# Patient Record
Sex: Female | Born: 1990 | Race: White | Hispanic: Yes | Marital: Married | Smoking: Former smoker
Health system: Southern US, Community
[De-identification: ages and names within clinical notes are randomized; demographics above are authoritative.]

## PROBLEM LIST (undated history)

## (undated) HISTORY — PX: NO PAST SURGERIES: SHX2092

---
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
## 2012-03-09 ENCOUNTER — Encounter: Payer: Self-pay | Admitting: Family Medicine

## 2012-03-28 ENCOUNTER — Encounter: Payer: Self-pay | Admitting: Family Medicine

## 2013-11-10 IMAGING — US US OB < 14 WEEKS - US OB TV
1 series · 14 of 28 positions shown · non-contrast
Comparison: none

REASON FOR EXAM: 8 wks  with vaginal bleeding eval for viability
COMMENTS:

[Series 1: us ob < 14 weeks - us ob tv · 0.37mm/px · 14 of 67 slices shown]
[im 3/67]
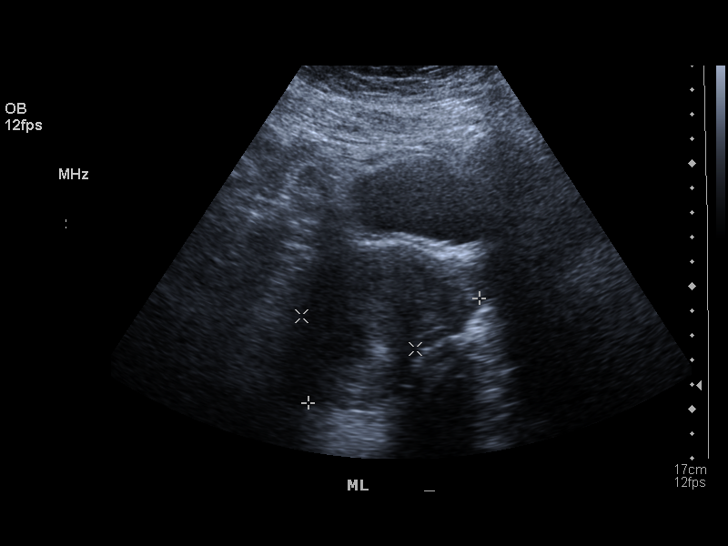
[im 8/67]
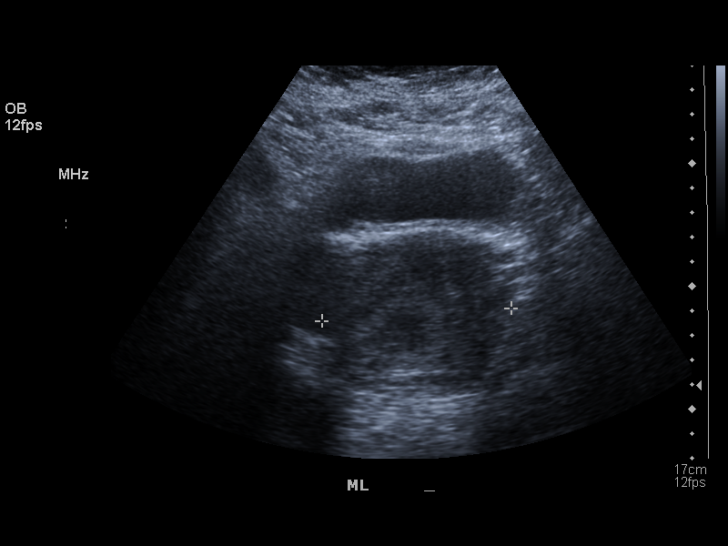
[im 13/67]
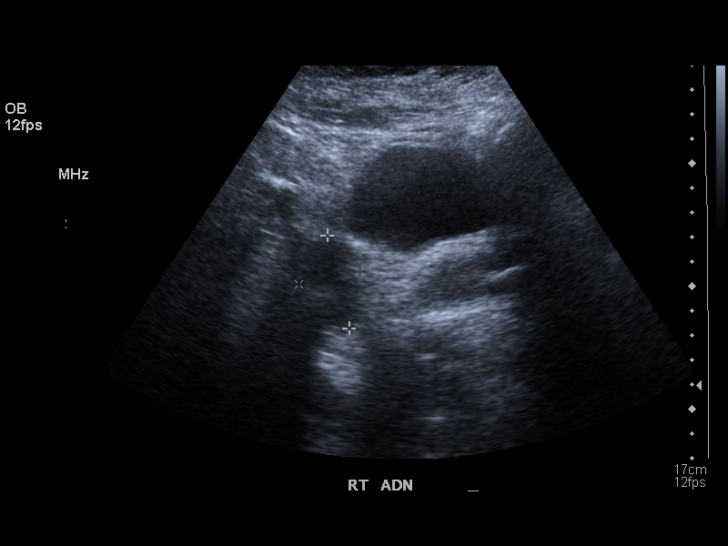
[im 18/67]
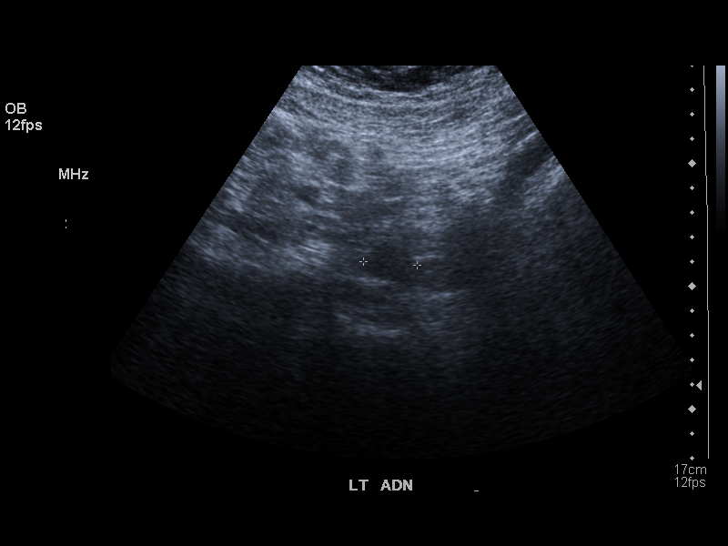
[im 23/67]
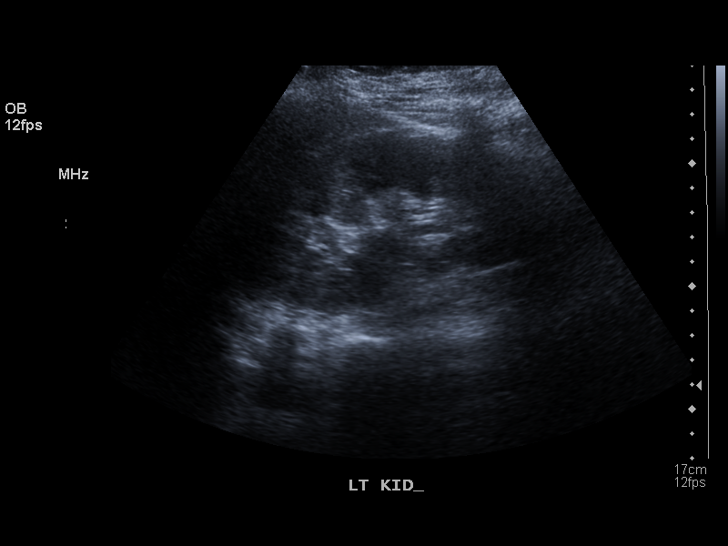
[im 27/67]
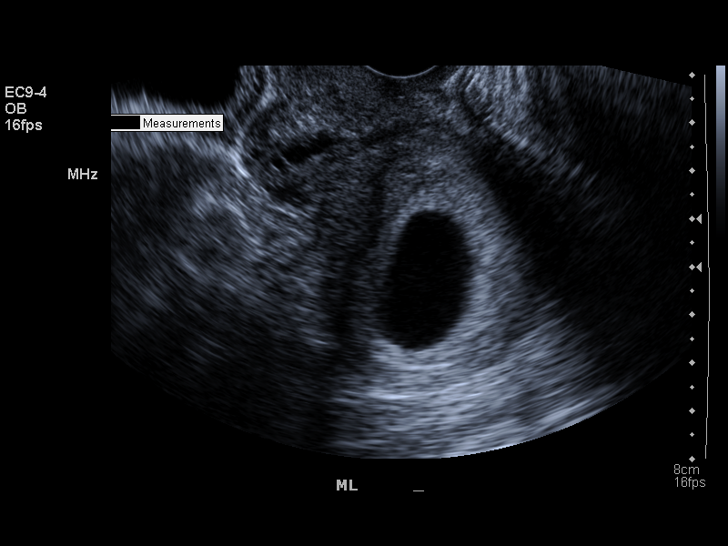
[im 32/67]
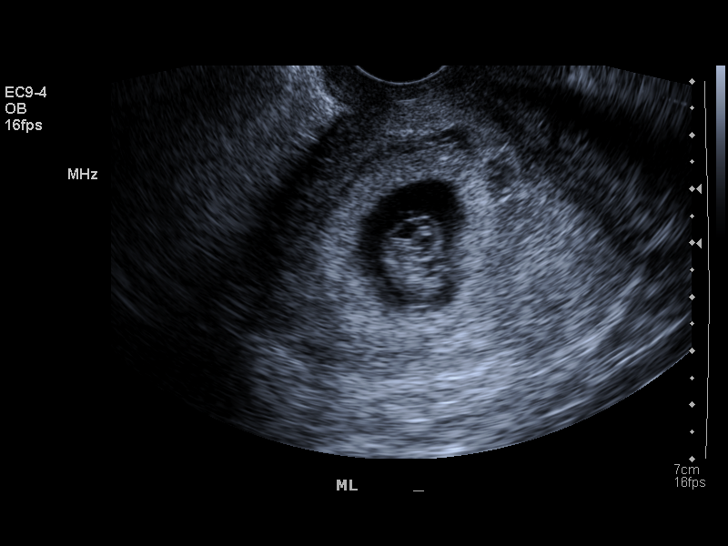
[im 37/67]
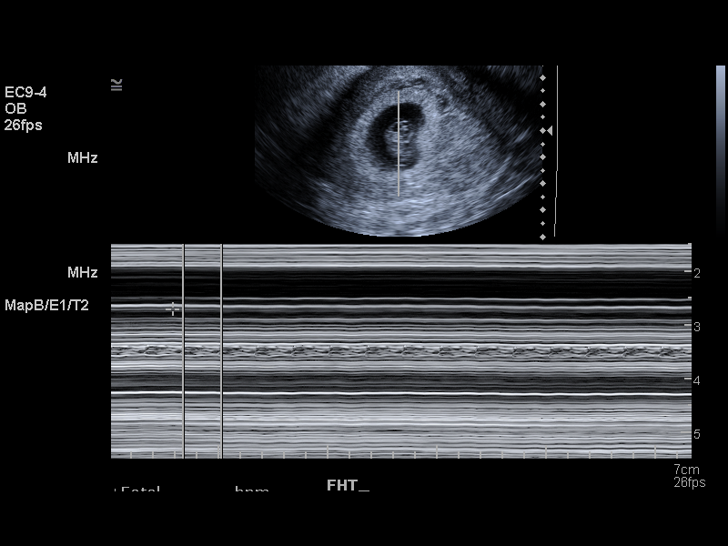
[im 42/67]
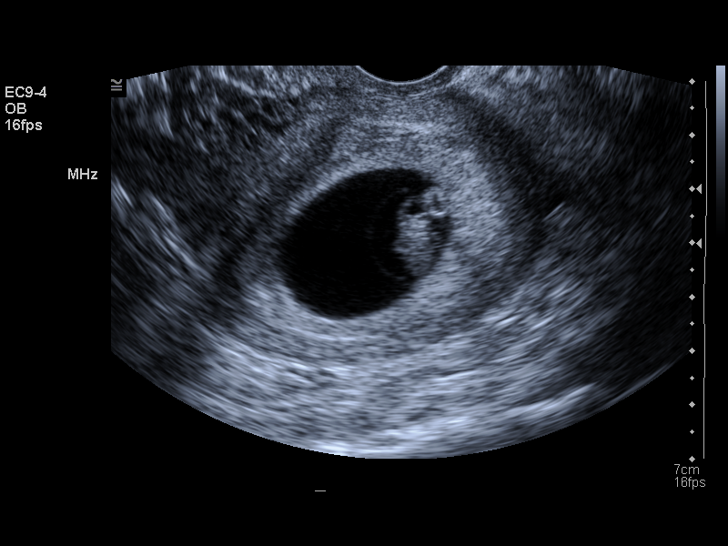
[im 47/67]
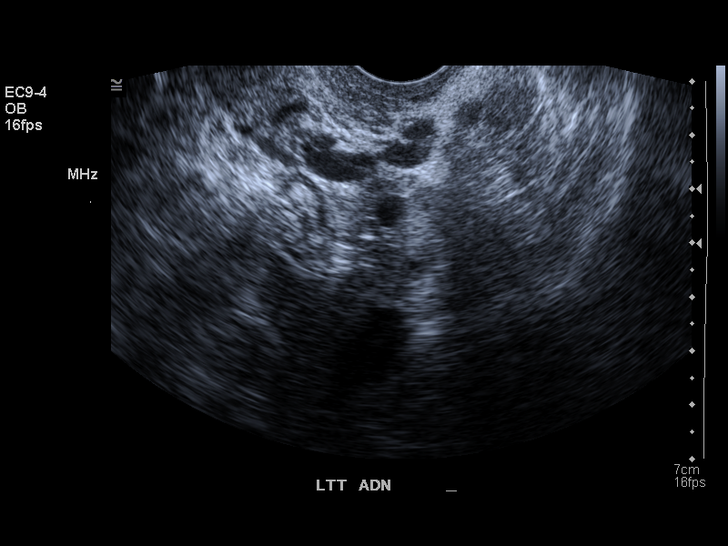
[im 52/67]
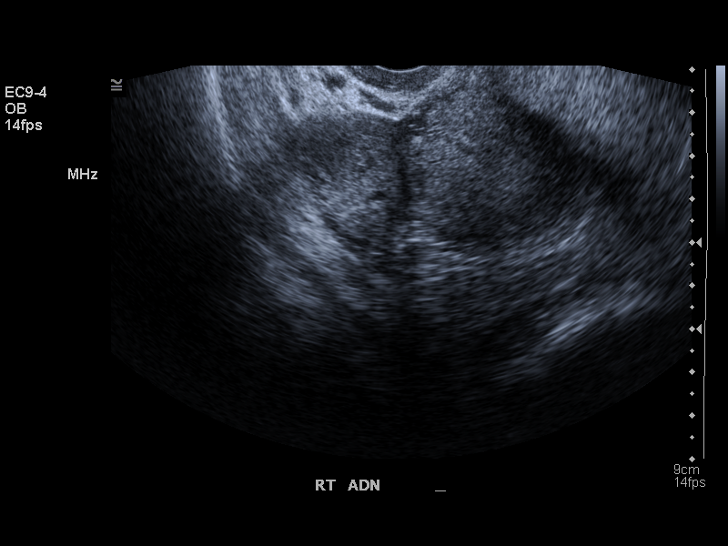
[im 57/67]
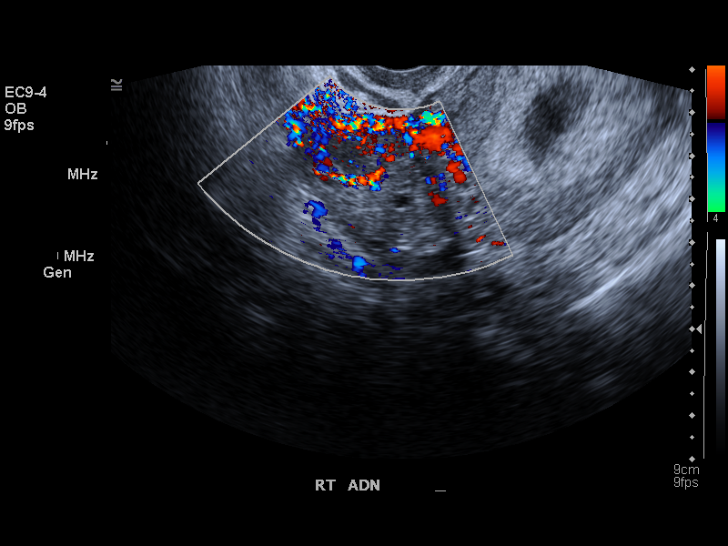
[im 62/67]
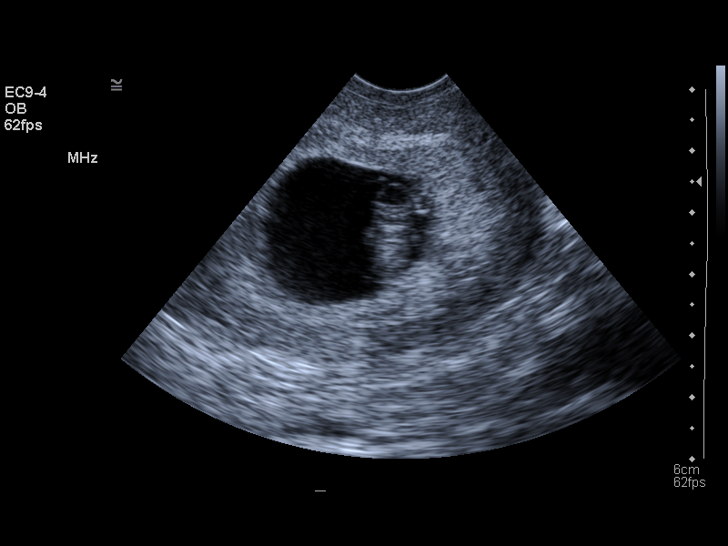
[im 67/67]
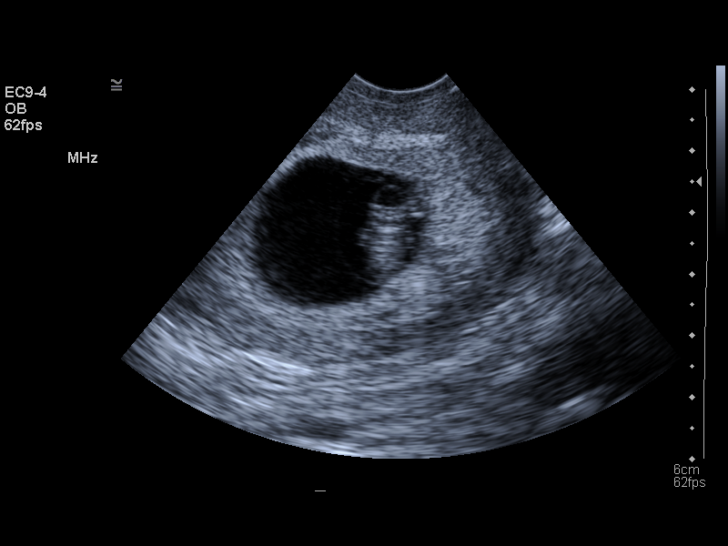

[14 of 28 positions shown; findings below may reference images not displayed]

PROCEDURE:     US  - US OB LESS THAN 14 WEEKS/W TRANS  - August 24, 2011  [DATE]

RESULT:     There is observed a single living intrauterine gestation. The
yolk sac is visualized. Embryo heart rate was monitored at 171 beats per
minute. The crown-rump length measures 16.7 mm which corresponds to an
estimated menstrual age of 8 weeks-3 day. Ultrasound EDD is 03/30/2012.
IMPRESSION: 1. There is a living intrauterine gestation of approximately 8 weeks-3 day
menstrual age.
2. The maternal ovaries are visualized bilaterally and are normal in
appearance.
3. No free fluid is seen in the maternal pelvis.
4. The maternal kidneys are visualized bilaterally and show no
hydronephrosis.

## 2013-12-16 IMAGING — US US OB < 14 WEEKS - US OB TV
1 series · 17 of 28 positions shown · non-contrast
Comparison: none

REASON FOR EXAM: vaginal bleeding
COMMENTS:

[Series 1: us ob < 14 weeks - us ob tv · 17 of 33 slices shown]
[im 1/33]
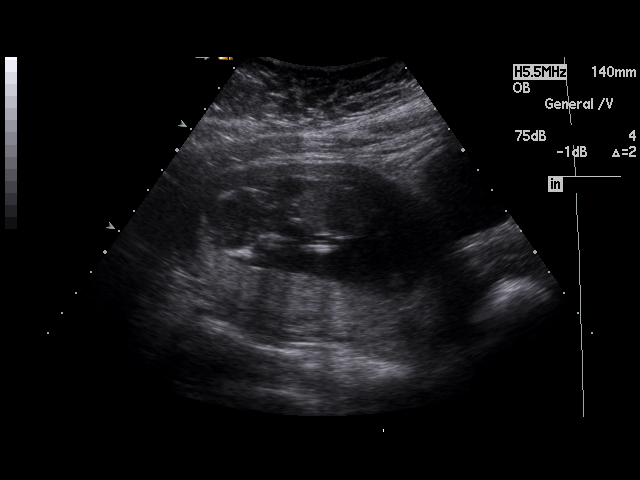
[im 3/33]
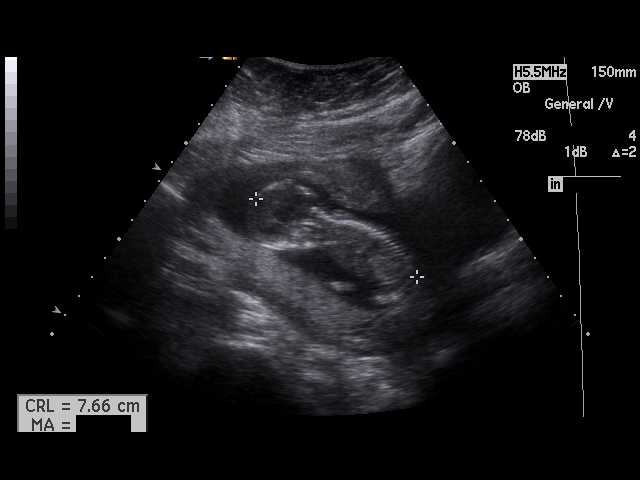
[im 5/33]
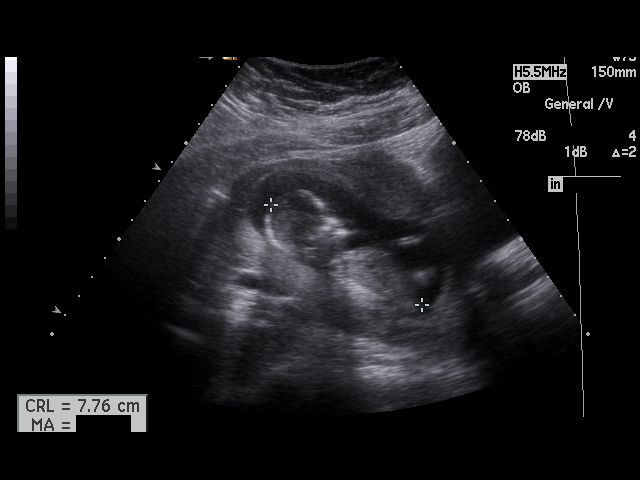
[im 6/33]
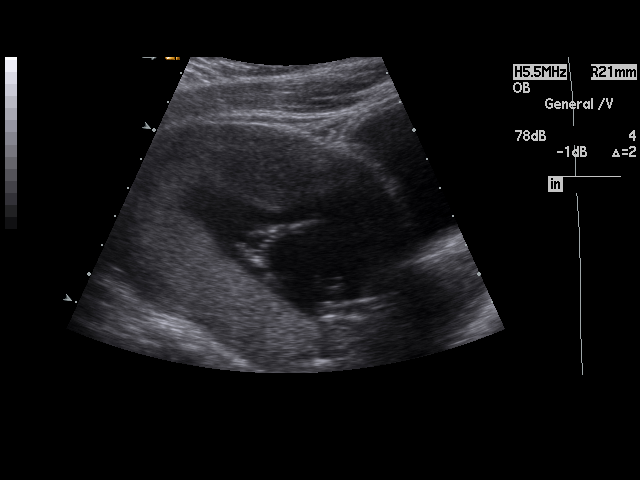
[im 9/33]
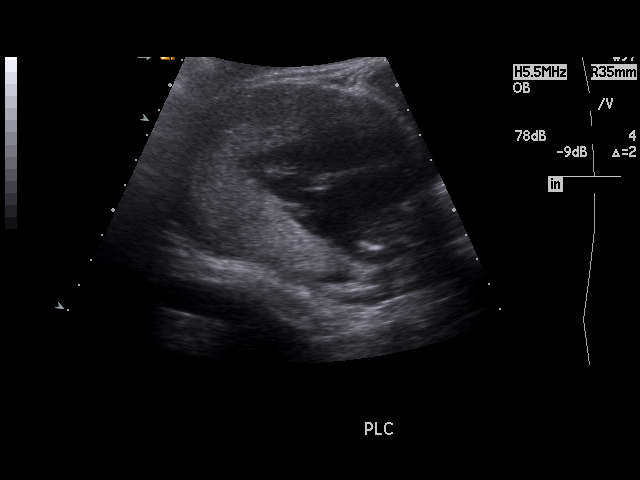
[im 11/33]
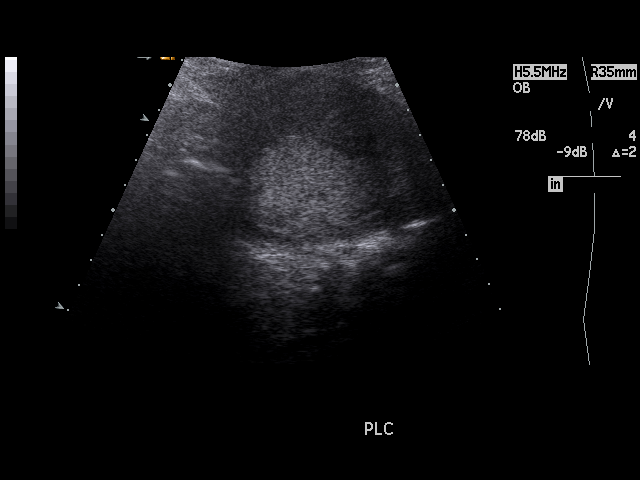
[im 12/33]
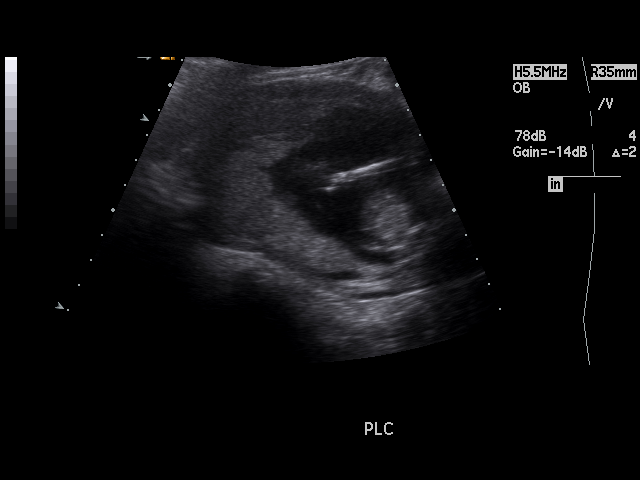
[im 15/33]
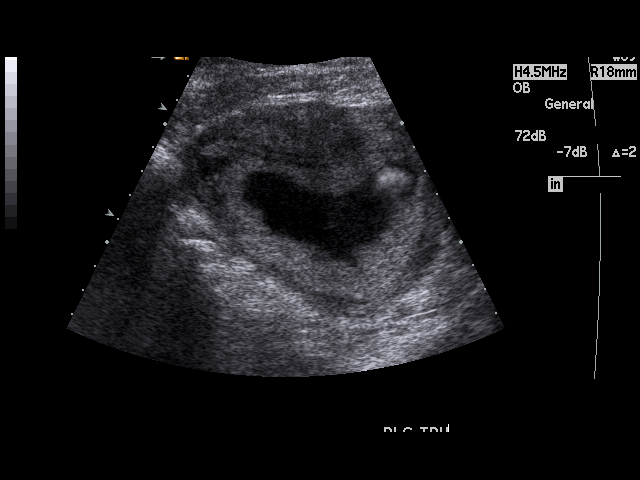
[im 17/33]
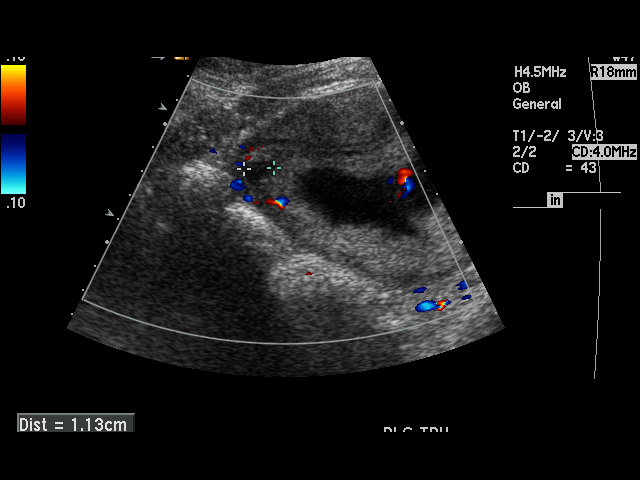
[im 18/33]
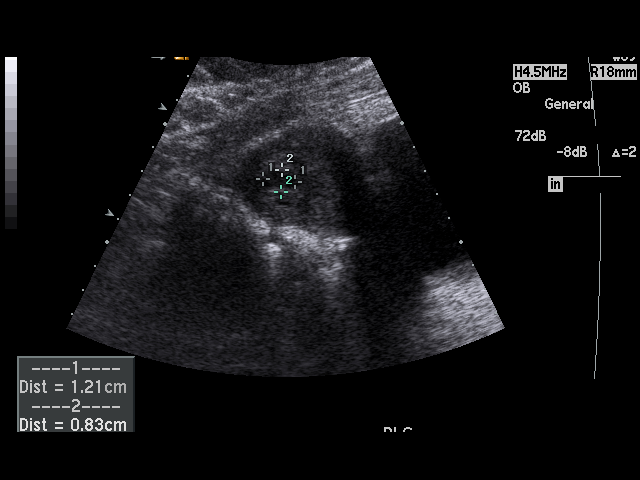
[im 21/33]
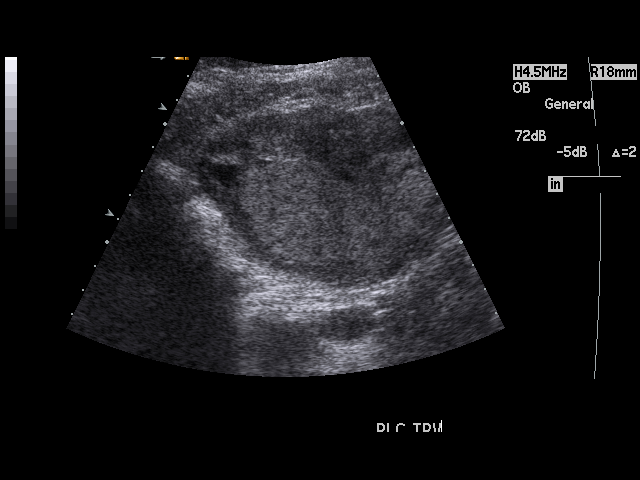
[im 22/33]
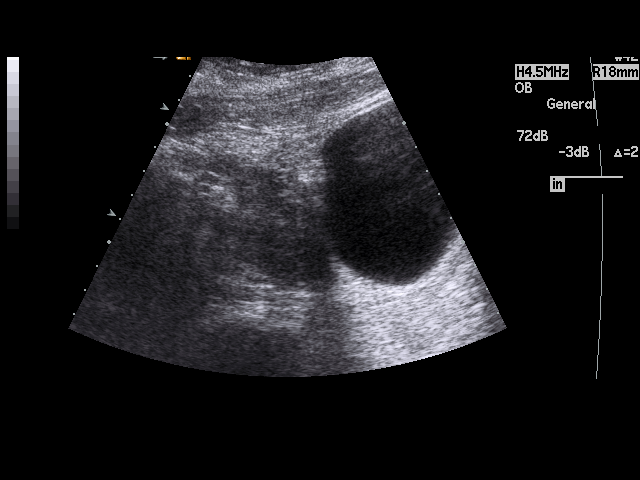
[im 24/33]
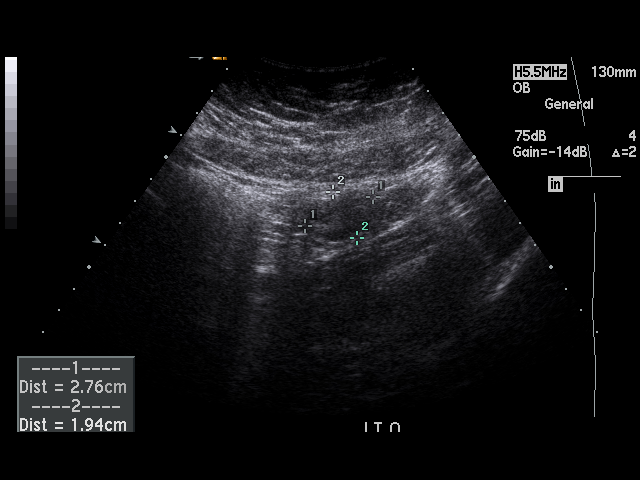
[im 27/33]
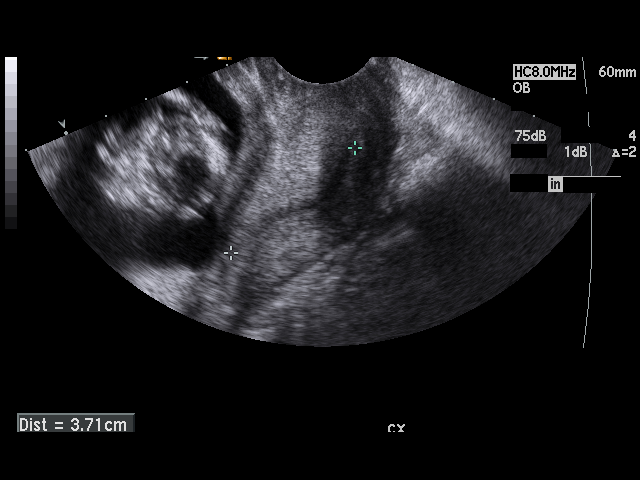
[im 28/33]
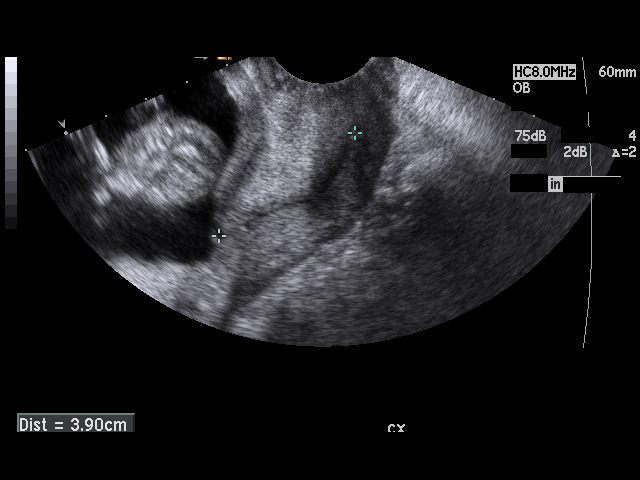
[im 30/33]
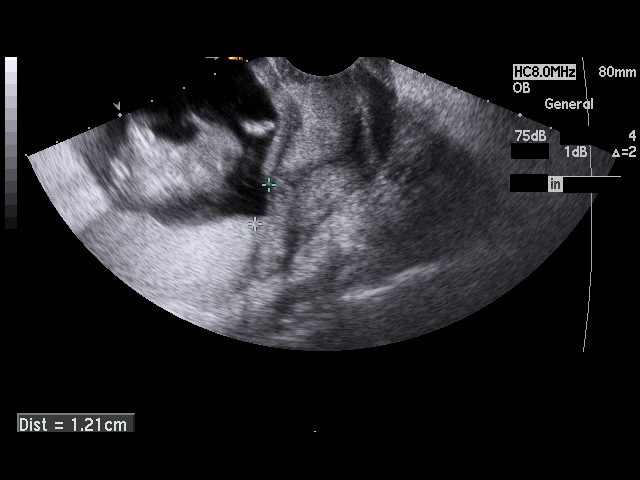
[im 33/33]
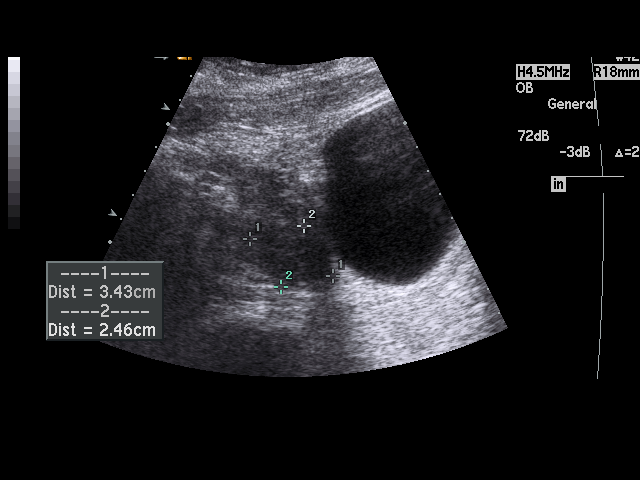

[17 of 28 positions shown; findings below may reference images not displayed]

PROCEDURE:     US  - US OB LESS THAN 14 WEEKS/W TRANS  - September 29, 2011  [DATE]

RESULT:     Early OB protocol pelvic sonogram is obtained. The study
demonstrates a single intrauterine fetus with a heart rate of 160 beats per
minute. No adnexal mass is seen. There is a 7.81 cm mean crown-rump length
measurement consistent with a 13 week 6 days + / - 8 days intrauterine
gestation with an ultrasound EDD March 30, 2012. The cervix is closed
and measures 3.90 cm in length. The distance from the edge of the placenta
to the internal cervical os is 1.37 cm. There is a small area of
hypoechogenic material consistent with a subchorionic hemorrhage measuring
up to 1.21 x 0.83 x 1.13 cm. The placenta appears to be fundal and
posterior. The ovaries are seen and appear within normal limits. The right
ovary measures 3.4 cm. The left ovary measures 3.1 cm. No free fluid is
evident. When compared to the previous exam of August 24, 2011, there
appears to have been appropriate interval growth.
IMPRESSION: 1. Subchorionic hemorrhage.
2. Viable intrauterine fetus.
3. Other findings as discussed above.

## 2015-09-22 ENCOUNTER — Other Ambulatory Visit: Payer: Self-pay | Admitting: Nephrology

## 2015-09-22 DIAGNOSIS — R109 Unspecified abdominal pain: Secondary | ICD-10-CM

## 2015-09-29 ENCOUNTER — Ambulatory Visit: Payer: Managed Care, Other (non HMO)

## 2019-06-29 NOTE — L&D Delivery Note (Signed)
Delivery Note  Date of delivery: 06/14/2020 Estimated Date of Delivery: 06/27/20 Patient's last menstrual period was 08/27/2019 (approximate). EGA: [redacted]w[redacted]d  Delivery Note At 12:37 AM a viable Female was delivered via Vaginal, Spontaneous (Presentation: Right Occiput Anterior).  APGAR: , ; weight  Pending.  Placenta status: Spontaneous, Intact.  Cord: 3 vessels with the following complications:  none.  Cord pH: n/a  First Stage: Labor onset: ~2100 Augmentation : None Analgesia /Anesthesia intrapartum: Epidural SROM at 0900  Shannon Rodgers presented to L&D with PROM. She was given 1 dose of cytotec. Epidural placed.   Second Stage: Complete dilation at 0012 Onset of pushing at 0028 FHR second stage Cat I Delivery at 0037 on 06/14/2020  She progressed to complete and had a spontaneous vaginal birth of a live female over an intact perineum. The fetal head was delivered in OA position with restitution to ROA. No nuchal cord however the cord was wrapped around baby's right wrist. Anterior then posterior shoulders delivered spontaneously. Baby placed on mom's abdomen and attended to by transition RN. Cord clamped and cut when pulseless by FOB.   Third Stage: Placenta delivered intact with 3VC at 0041 Placenta disposition: routine Uterine tone firm / bleeding min IV pitocin given for hemorrhage prophylaxis  Anesthesia: Epidural Episiotomy: None Lacerations:  none Suture Repair: n/a Est. Blood Loss (mL):   Complications: compound presentation  Mom to postpartum.  Baby to Couplet care / Skin to Skin.  Newborn: Birth Weight: pending  Apgar Scores: 8, 9 Feeding planned: Breastfeeding   [name], CNM 06/14/2020 12:46 AM

## 2020-01-10 LAB — OB RESULTS CONSOLE ABO/RH: RH Type: POSITIVE

## 2020-01-10 LAB — OB RESULTS CONSOLE VARICELLA ZOSTER ANTIBODY, IGG: Varicella: IMMUNE

## 2020-01-10 LAB — OB RESULTS CONSOLE RUBELLA ANTIBODY, IGM: Rubella: IMMUNE

## 2020-01-10 LAB — OB RESULTS CONSOLE HEPATITIS B SURFACE ANTIGEN: Hepatitis B Surface Ag: NEGATIVE

## 2020-01-10 LAB — OB RESULTS CONSOLE HIV ANTIBODY (ROUTINE TESTING): HIV: NONREACTIVE

## 2020-01-10 LAB — OB RESULTS CONSOLE ANTIBODY SCREEN: Antibody Screen: NEGATIVE

## 2020-01-29 ENCOUNTER — Observation Stay: Payer: Medicaid Other

## 2020-01-29 ENCOUNTER — Observation Stay
Admission: EM | Admit: 2020-01-29 | Discharge: 2020-01-30 | Disposition: A | Discharge status: Home / Self Care | Payer: Medicaid Other | Attending: Certified Nurse Midwife | Admitting: Certified Nurse Midwife

## 2020-01-29 ENCOUNTER — Other Ambulatory Visit: Payer: Self-pay

## 2020-01-29 DIAGNOSIS — Z3A20 20 weeks gestation of pregnancy: Secondary | ICD-10-CM | POA: Diagnosis not present

## 2020-01-29 DIAGNOSIS — R1030 Lower abdominal pain, unspecified: Secondary | ICD-10-CM | POA: Diagnosis present

## 2020-01-29 DIAGNOSIS — O26899 Other specified pregnancy related conditions, unspecified trimester: Secondary | ICD-10-CM

## 2020-01-29 DIAGNOSIS — O99891 Other specified diseases and conditions complicating pregnancy: Secondary | ICD-10-CM | POA: Diagnosis present

## 2020-01-29 DIAGNOSIS — O26712 Subluxation of symphysis (pubis) in pregnancy, second trimester: Secondary | ICD-10-CM | POA: Diagnosis not present

## 2020-01-29 DIAGNOSIS — R109 Unspecified abdominal pain: Secondary | ICD-10-CM

## 2020-01-29 DIAGNOSIS — N949 Unspecified condition associated with female genital organs and menstrual cycle: Secondary | ICD-10-CM | POA: Diagnosis present

## 2020-01-29 LAB — WET PREP, GENITAL
Clue Cells Wet Prep HPF POC: NONE SEEN
Sperm: NONE SEEN
Trich, Wet Prep: NONE SEEN
Yeast Wet Prep HPF POC: NONE SEEN

## 2020-01-29 LAB — URINALYSIS, ROUTINE W REFLEX MICROSCOPIC
Bacteria, UA: NONE SEEN
Bilirubin Urine: NEGATIVE
Glucose, UA: NEGATIVE mg/dL
Hgb urine dipstick: NEGATIVE
Ketones, ur: NEGATIVE mg/dL
Nitrite: NEGATIVE
Protein, ur: NEGATIVE mg/dL
Specific Gravity, Urine: 1.02 (ref 1.005–1.030)
pH: 5 (ref 5.0–8.0)

## 2020-01-29 MED ORDER — ACETAMINOPHEN 325 MG PO TABS: 650.0000 mg | ORAL_TABLET | ORAL | Status: DC | PRN

## 2020-01-29 NOTE — OB Triage Note (Signed)
29yo, G2P1, pt reports lower back pain and sharp pain in the lower abdomen for the last 3 days more at night when she is lying in bed. Pt reports no bleeding, but does report discharge white in color. Pt reports no N/V/D, but does report having a soft stool BM today. No sexual intercourse in the last 24 hours.

## 2020-01-30 DIAGNOSIS — N949 Unspecified condition associated with female genital organs and menstrual cycle: Secondary | ICD-10-CM | POA: Diagnosis present

## 2020-01-30 DIAGNOSIS — O99891 Other specified diseases and conditions complicating pregnancy: Secondary | ICD-10-CM | POA: Diagnosis not present

## 2020-01-30 LAB — CBC
HCT: 31.5 % — ABNORMAL LOW (ref 36.0–46.0)
Hemoglobin: 10.7 g/dL — ABNORMAL LOW (ref 12.0–15.0)
MCH: 27.6 pg (ref 26.0–34.0)
MCHC: 34 g/dL (ref 30.0–36.0)
MCV: 81.2 fL (ref 80.0–100.0)
Platelets: 214 10*3/uL (ref 150–400)
RBC: 3.88 MIL/uL (ref 3.87–5.11)
RDW: 13.1 % (ref 11.5–15.5)
WBC: 9.1 10*3/uL (ref 4.0–10.5)
nRBC: 0 % (ref 0.0–0.2)

## 2020-01-30 LAB — CHLAMYDIA/NGC RT PCR (ARMC ONLY)
Chlamydia Tr: NOT DETECTED
N gonorrhoeae: NOT DETECTED

## 2020-01-30 LAB — URINE CULTURE: Culture: NO GROWTH

## 2020-01-30 NOTE — Discharge Summary (Signed)
Patient ID: Shannon Rodgers MRN: [id number] DOB/AGE: [birth year] 29 y.o.  Admit date: 01/29/2020 Discharge date: 01/30/2020  Admission Diagnoses: Pain at the pubic bone  Discharge Diagnoses: Smphysis pubis dysfunction  Prenatal Procedures: none  Consults: None  Significant Diagnostic Studies:  Results for orders placed or performed during the hospital encounter of 01/29/20 (from the past 168 hour(s))  Wet prep, genital   Collection Time: 01/29/20 11:20 PM  Result Value Ref Range   Yeast Wet Prep HPF POC NONE SEEN NONE SEEN   Trich, Wet Prep NONE SEEN NONE SEEN   Clue Cells Wet Prep HPF POC NONE SEEN NONE SEEN   WBC, Wet Prep HPF POC FEW (A) NONE SEEN   Sperm NONE SEEN   Chlamydia/NGC rt PCR ([facility] only)   Collection Time: 01/29/20 11:20 PM  Result Value Ref Range   Specimen source GC/Chlam URINE, RANDOM    Chlamydia Tr NOT DETECTED NOT DETECTED   N gonorrhoeae NOT DETECTED NOT DETECTED  Urinalysis, Routine w reflex microscopic   Collection Time: 01/29/20 11:20 PM  Result Value Ref Range   Color, Urine YELLOW (A) YELLOW   APPearance HAZY (A) CLEAR   Specific Gravity, Urine 1.020 1.005 - 1.030   pH 5.0 5.0 - 8.0   Glucose, UA NEGATIVE NEGATIVE mg/dL   Hgb urine dipstick NEGATIVE NEGATIVE   Bilirubin Urine NEGATIVE NEGATIVE   Ketones, ur NEGATIVE NEGATIVE mg/dL   Protein, ur NEGATIVE NEGATIVE mg/dL   Nitrite NEGATIVE NEGATIVE   Leukocytes,Ua SMALL (A) NEGATIVE   RBC / HPF 0-5 0 - 5 RBC/hpf   WBC, UA 0-5 0 - 5 WBC/hpf   Bacteria, UA NONE SEEN NONE SEEN   Squamous Epithelial / LPF 0-5 0 - 5   Mucus PRESENT   CBC   Collection Time: 01/30/20 12:43 AM  Result Value Ref Range   WBC 9.1 4.0 - 10.5 K/uL   RBC 3.88 3.87 - 5.11 MIL/uL   Hemoglobin 10.7 (L) 12.0 - 15.0 g/dL   HCT 63.8 (L) 36 - 46 %   MCV 81.2 80.0 - 100.0 fL   MCH 27.6 26.0 - 34.0 pg   MCHC 34.0 30.0 - 36.0 g/dL   RDW 75.6 43.3 - 29.5 %   Platelets 214 150 - 400 K/uL   nRBC 0.0 0.0 - 0.2 %   EXAM: LIMITED  OBSTETRIC ULTRASOUND FINDINGS: Number of Fetuses: 1 Heart Rate:  147 bpm Movement: Yes Presentation: Breech Placental Location: Anterior Previa: No Amniotic Fluid (Subjective):  Within normal limits. BPD: 4.2 cm 18 w  5 d MATERNAL FINDINGS: Cervix:  Appears closed. Uterus/Adnexae: No abnormality visualized. IMPRESSION: Single viable intrauterine pregnancy. No specific abnormalities are visualized.  Treatments: ultrasound  Hospital Course:  This is a 29 y.o. G2P1 with IUP at [redacted]w[redacted]d seen for pubic bone pain.  Only seen once by CD and has not yet had an ultrasound with this pregnancy.  Labs and u/s done.  She was observed, and had no signs/symptoms of maternal-fetal concerns.  Precautions discussed. She was deemed stable for discharge to home with outpatient follow up.  Discharge Physical Exam:  BP 129/69 (BP Location: Left Arm)   Pulse 98   Temp 97.6 F (36.4 C) (Oral)   Resp 16   LMP 08/27/2019 (Approximate)   General: NAD CV: RRR Pulm: CTABL, nl effort ABD: s/nd/nt, gravid DVT Evaluation: LE non-ttp, no evidence of DVT on exam.       Discharge Condition: Stable  Disposition: Discharge disposition: 01-Home or Self Care  Allergies as of 01/30/2020      Reactions   Penicillins Swelling      Medication List    You have not been prescribed any medications.     Follow-up Information    Center, [facility]. Schedule an appointment as soon as possible for a visit.   Specialty: General Practice Contact information: [address] [place] [address] [phone]               Signed:  Haroldine Laws, CNM 01/30/2020 1:42 AM

## 2020-06-04 LAB — OB RESULTS CONSOLE RPR: RPR: NONREACTIVE

## 2020-06-05 LAB — OB RESULTS CONSOLE GBS: GBS: NEGATIVE

## 2020-06-06 LAB — OB RESULTS CONSOLE GC/CHLAMYDIA
Chlamydia: NEGATIVE
Gonorrhea: NEGATIVE

## 2020-06-13 ENCOUNTER — Inpatient Hospital Stay: Payer: Medicaid Other | Admitting: Anesthesiology

## 2020-06-13 ENCOUNTER — Other Ambulatory Visit: Payer: Self-pay

## 2020-06-13 ENCOUNTER — Encounter: Payer: Self-pay | Admitting: Obstetrics and Gynecology

## 2020-06-13 ENCOUNTER — Inpatient Hospital Stay
Admission: EM | Admit: 2020-06-13 | Discharge: 2020-06-15 | DRG: 807 | Disposition: A | Discharge status: Home / Self Care | Payer: Medicaid Other | Attending: Certified Nurse Midwife | Admitting: Certified Nurse Midwife

## 2020-06-13 DIAGNOSIS — Z3A38 38 weeks gestation of pregnancy: Secondary | ICD-10-CM

## 2020-06-13 DIAGNOSIS — O163 Unspecified maternal hypertension, third trimester: Secondary | ICD-10-CM | POA: Diagnosis present

## 2020-06-13 DIAGNOSIS — O0930 Supervision of pregnancy with insufficient antenatal care, unspecified trimester: Secondary | ICD-10-CM

## 2020-06-13 DIAGNOSIS — O326XX0 Maternal care for compound presentation, not applicable or unspecified: Secondary | ICD-10-CM | POA: Diagnosis present

## 2020-06-13 DIAGNOSIS — O4292 Full-term premature rupture of membranes, unspecified as to length of time between rupture and onset of labor: Secondary | ICD-10-CM | POA: Diagnosis present

## 2020-06-13 DIAGNOSIS — O26893 Other specified pregnancy related conditions, third trimester: Secondary | ICD-10-CM | POA: Diagnosis present

## 2020-06-13 DIAGNOSIS — O4290 Premature rupture of membranes, unspecified as to length of time between rupture and onset of labor, unspecified weeks of gestation: Secondary | ICD-10-CM | POA: Diagnosis present

## 2020-06-13 DIAGNOSIS — Z20822 Contact with and (suspected) exposure to covid-19: Secondary | ICD-10-CM | POA: Diagnosis present

## 2020-06-13 DIAGNOSIS — Z6841 Body Mass Index (BMI) 40.0 and over, adult: Secondary | ICD-10-CM

## 2020-06-13 DIAGNOSIS — O99214 Obesity complicating childbirth: Secondary | ICD-10-CM | POA: Diagnosis present

## 2020-06-13 LAB — URINE DRUG SCREEN, QUALITATIVE (ARMC ONLY)
Amphetamines, Ur Screen: NOT DETECTED
Barbiturates, Ur Screen: NOT DETECTED
Benzodiazepine, Ur Scrn: NOT DETECTED
Cannabinoid 50 Ng, Ur ~~LOC~~: NOT DETECTED
Cocaine Metabolite,Ur ~~LOC~~: NOT DETECTED
MDMA (Ecstasy)Ur Screen: NOT DETECTED
Methadone Scn, Ur: NOT DETECTED
Opiate, Ur Screen: NOT DETECTED
Phencyclidine (PCP) Ur S: NOT DETECTED
Tricyclic, Ur Screen: NOT DETECTED

## 2020-06-13 LAB — COMPREHENSIVE METABOLIC PANEL
ALT: 11 U/L (ref 0–44)
AST: 14 U/L — ABNORMAL LOW (ref 15–41)
Albumin: 2.7 g/dL — ABNORMAL LOW (ref 3.5–5.0)
Alkaline Phosphatase: 91 U/L (ref 38–126)
Anion gap: 11 (ref 5–15)
BUN: 10 mg/dL (ref 6–20)
CO2: 20 mmol/L — ABNORMAL LOW (ref 22–32)
Calcium: 9 mg/dL (ref 8.9–10.3)
Chloride: 105 mmol/L (ref 98–111)
Creatinine, Ser: 0.47 mg/dL (ref 0.44–1.00)
GFR, Estimated: >60 mL/min (ref >60)
Glucose, Bld: 83 mg/dL (ref 70–99)
Potassium: 3.8 mmol/L (ref 3.5–5.1)
Sodium: 136 mmol/L (ref 135–145)
Total Bilirubin: 0.3 mg/dL (ref 0.3–1.2)
Total Protein: 6.3 g/dL — ABNORMAL LOW (ref 6.5–8.1)

## 2020-06-13 LAB — PROTEIN / CREATININE RATIO, URINE
Creatinine, Urine: 169 mg/dL
Protein Creatinine Ratio: 0.09 mg/mg{Cre} (ref 0.00–0.15)
Total Protein, Urine: 16 mg/dL

## 2020-06-13 LAB — TYPE AND SCREEN
ABO/RH(D): B POS
Antibody Screen: NEGATIVE

## 2020-06-13 LAB — CBC
HCT: 34 % — ABNORMAL LOW (ref 36.0–46.0)
Hemoglobin: 11.4 g/dL — ABNORMAL LOW (ref 12.0–15.0)
MCH: 26 pg (ref 26.0–34.0)
MCHC: 33.5 g/dL (ref 30.0–36.0)
MCV: 77.4 fL — ABNORMAL LOW (ref 80.0–100.0)
Platelets: 215 10*3/uL (ref 150–400)
RBC: 4.39 MIL/uL (ref 3.87–5.11)
RDW: 14.6 % (ref 11.5–15.5)
WBC: 9 10*3/uL (ref 4.0–10.5)
nRBC: 0 % (ref 0.0–0.2)

## 2020-06-13 LAB — RESP PANEL BY RT-PCR (FLU A&B, COVID) ARPGX2
Influenza A by PCR: NEGATIVE
Influenza B by PCR: NEGATIVE
SARS Coronavirus 2 by RT PCR: NEGATIVE

## 2020-06-13 LAB — ABO/RH: ABO/RH(D): B POS

## 2020-06-13 MED ORDER — PHENYLEPHRINE 40 MCG/ML (10ML) SYRINGE FOR IV PUSH (FOR BLOOD PRESSURE SUPPORT): 80.0000 ug | PREFILLED_SYRINGE | INTRAVENOUS | Status: DC | PRN

## 2020-06-13 MED ORDER — DIPHENHYDRAMINE HCL 50 MG/ML IJ SOLN: 12.5000 mg | INTRAMUSCULAR | Status: DC | PRN

## 2020-06-13 MED ORDER — EPHEDRINE 5 MG/ML INJ: 10.0000 mg | INTRAVENOUS | Status: DC | PRN

## 2020-06-13 MED ORDER — MISOPROSTOL 50MCG HALF TABLET
ORAL_TABLET | ORAL | Status: AC
  Administered 2020-06-13: 16:00:00 50 ug via VAGINAL
  Filled 2020-06-13: qty 1

## 2020-06-13 MED ORDER — SODIUM CHLORIDE 0.9 % IV SOLN
INTRAVENOUS | Status: DC | PRN
  Administered 2020-06-13: 10 mL via EPIDURAL

## 2020-06-13 MED ORDER — MISOPROSTOL 25 MCG QUARTER TABLET
ORAL_TABLET | ORAL | Status: AC
  Filled 2020-06-13: qty 1

## 2020-06-13 MED ORDER — OXYTOCIN 10 UNIT/ML IJ SOLN
INTRAMUSCULAR | Status: AC
  Filled 2020-06-13: qty 2

## 2020-06-13 MED ORDER — LIDOCAINE HCL (PF) 1 % IJ SOLN
INTRAMUSCULAR | Status: DC | PRN
  Administered 2020-06-13: 3 mL

## 2020-06-13 MED ORDER — ONDANSETRON HCL 4 MG/2ML IJ SOLN
4.0000 mg | Freq: Four times a day (QID) | INTRAMUSCULAR | Status: DC | PRN
  Administered 2020-06-14: 4 mg via INTRAVENOUS
  Filled 2020-06-13: qty 2

## 2020-06-13 MED ORDER — OXYCODONE-ACETAMINOPHEN 5-325 MG PO TABS: 2.0000 | ORAL_TABLET | ORAL | Status: DC | PRN

## 2020-06-13 MED ORDER — OXYTOCIN-SODIUM CHLORIDE 30-0.9 UT/500ML-% IV SOLN
INTRAVENOUS | Status: AC
  Filled 2020-06-13: qty 1000

## 2020-06-13 MED ORDER — SOD CITRATE-CITRIC ACID 500-334 MG/5ML PO SOLN: 30.0000 mL | ORAL | Status: DC | PRN

## 2020-06-13 MED ORDER — LIDOCAINE-EPINEPHRINE (PF) 1.5 %-1:200000 IJ SOLN
INTRAMUSCULAR | Status: DC | PRN
  Administered 2020-06-13: 3 mL via EPIDURAL

## 2020-06-13 MED ORDER — OXYTOCIN-SODIUM CHLORIDE 30-0.9 UT/500ML-% IV SOLN: 2.5000 [IU]/h | INTRAVENOUS | Status: DC

## 2020-06-13 MED ORDER — FENTANYL 2.5 MCG/ML W/ROPIVACAINE 0.15% IN NS 100 ML EPIDURAL (ARMC)
EPIDURAL | Status: AC
  Filled 2020-06-13: qty 100

## 2020-06-13 MED ORDER — LIDOCAINE HCL (PF) 1 % IJ SOLN
INTRAMUSCULAR | Status: AC
  Filled 2020-06-13: qty 30

## 2020-06-13 MED ORDER — AMMONIA AROMATIC IN INHA
RESPIRATORY_TRACT | Status: AC
  Filled 2020-06-13: qty 10

## 2020-06-13 MED ORDER — ACETAMINOPHEN 325 MG PO TABS: 650.0000 mg | ORAL_TABLET | ORAL | Status: DC | PRN

## 2020-06-13 MED ORDER — LACTATED RINGERS IV SOLN
500.0000 mL | Freq: Once | INTRAVENOUS | Status: AC
  Administered 2020-06-13: 23:00:00 500 mL via INTRAVENOUS

## 2020-06-13 MED ORDER — LACTATED RINGERS IV SOLN: 500.0000 mL | INTRAVENOUS | Status: DC | PRN

## 2020-06-13 MED ORDER — LIDOCAINE HCL (PF) 1 % IJ SOLN: 30.0000 mL | INTRAMUSCULAR | Status: DC | PRN

## 2020-06-13 MED ORDER — OXYCODONE-ACETAMINOPHEN 5-325 MG PO TABS
1.0000 | ORAL_TABLET | ORAL | Status: DC | PRN
Start: 2020-06-13 — End: 2020-06-14

## 2020-06-13 MED ORDER — LACTATED RINGERS IV SOLN: INTRAVENOUS | Status: DC

## 2020-06-13 MED ORDER — OXYTOCIN BOLUS FROM INFUSION
333.0000 mL | Freq: Once | INTRAVENOUS | Status: AC
  Administered 2020-06-14: 01:00:00 333 mL via INTRAVENOUS

## 2020-06-13 MED ORDER — MISOPROSTOL 50MCG HALF TABLET: 50.0000 ug | ORAL_TABLET | Freq: Once | ORAL | Status: AC

## 2020-06-13 MED ORDER — FENTANYL 2.5 MCG/ML W/ROPIVACAINE 0.15% IN NS 100 ML EPIDURAL (ARMC)
12.0000 mL/h | EPIDURAL | Status: DC
Start: 2020-06-14 — End: 2020-06-14
  Administered 2020-06-13: 12 mL/h via EPIDURAL

## 2020-06-13 MED ORDER — MISOPROSTOL 200 MCG PO TABS
ORAL_TABLET | ORAL | Status: AC
  Filled 2020-06-13: qty 4

## 2020-06-13 NOTE — Progress Notes (Signed)
Labor Progress Note  Shannon Rodgers is a 29 y.o. G2P1 at [redacted]w[redacted]d admitted for induction of labor due to Spontaneous rupture of BOW at 0900.  Subjective: Pt is feeling UCs more intense.  Objective: BP (!) 148/96   Pulse 90   Temp 98.2 F (36.8 C) (Oral)   Resp 15   Ht 5\' 6"  (1.676 m)   Wt 135.2 kg   LMP 08/27/2019 (Approximate)   BMI 48.10 kg/m   Fetal Assessment: FHT:  FHR: 135 bpm, variability: moderate,  accelerations:  Present,  decelerations:  Absent Category/reactivity:  Category I UC:   Regular 1-4 mins SVE:    Dilation: 1cm  Effacement: 50%  Station:  -3  Consistency: medium  Position: middle  Membrane status:SROM'd 0900 Amniotic color: Clear   Assessment / Plan: Induction of labor due to PROM, Cytotec 10/27/2019 PV given at 1629 UCs are regular and getting more intense, pt is having to breath through each contraction.  Will watch for next hour to see if labor picks up on its own.  Encouraged pt to get out of bed if she thinks that would help with pain.  Labor: Progressing normally Preeclampsia:  148/96 denies HA, vision changes or epigastric pain Fetal Wellbeing:  Category I Pain Control:  Labor support without medications I/D:  Afebrile, GBS neg, SROM x 11hr Anticipated MOD:  NSVD  , CNM 06/13/2020, 8:43 PM

## 2020-06-13 NOTE — Anesthesia Procedure Notes (Signed)
Epidural Patient location during procedure: OB Start time: 06/13/2020 10:37 PM End time: 06/13/2020 10:59 PM  Staffing Anesthesiologist: Corinda Gubler, MD Performed: anesthesiologist   Preanesthetic Checklist Completed: patient identified, IV checked, site marked, risks and benefits discussed, surgical consent, monitors and equipment checked, pre-op evaluation and timeout performed  Epidural Patient position: sitting Prep: ChloraPrep Patient monitoring: heart rate, continuous pulse ox and blood pressure Approach: midline Location: L3-L4 Injection technique: LOR saline  Needle:  Needle type: Tuohy  Needle gauge: 17 G Needle length: 9 cm and 9 Needle insertion depth: 9 cm Catheter type: closed end flexible Catheter size: 19 Gauge Catheter at skin depth: 14 cm Test dose: negative and 1.5% lidocaine with Epi 1:200 K  Assessment Sensory level: T10 Events: blood not aspirated, injection not painful, no injection resistance, no paresthesia and negative IV test  Additional Notes 1st attempt Pt. Evaluated and documentation done after procedure finished. Patient identified. Risks/Benefits/Options discussed with patient including but not limited to bleeding, infection, nerve damage, paralysis, failed block, incomplete pain control, headache, blood pressure changes, nausea, vomiting, reactions to medication both or allergic, itching and postpartum back pain. Confirmed with bedside nurse the patient's most recent platelet count. Confirmed with patient that they are not currently taking any anticoagulation, have any bleeding history or any family history of bleeding disorders. Patient expressed understanding and wished to proceed. All questions were answered. Sterile technique was used throughout the entire procedure. Please see nursing notes for vital signs. Test dose was given through epidural catheter and negative prior to continuing to dose epidural or start infusion. Warning signs of high  block given to the patient including shortness of breath, tingling/numbness in hands, complete motor block, or any concerning symptoms with instructions to call for help. Patient was given instructions on fall risk and not to get out of bed. All questions and concerns addressed with instructions to call with any issues or inadequate analgesia.   Patient tolerated the insertion well without immediate complications.Reason for block:procedure for pain

## 2020-06-13 NOTE — Anesthesia Preprocedure Evaluation (Signed)
Anesthesia Evaluation  Patient identified by MRN, date of birth, ID band Patient awake    Reviewed: Allergy & Precautions, NPO status , Patient's Chart, lab work & pertinent test results  History of Anesthesia Complications Negative for: history of anesthetic complications  Airway Mallampati: III  TM Distance: >3 FB Neck ROM: Full    Dental no notable dental hx. (+) Teeth Intact   Pulmonary neg pulmonary ROS, neg sleep apnea, neg COPD, Patient abstained from smoking.Not current smoker, former smoker,    Pulmonary exam normal breath sounds clear to auscultation       Cardiovascular Exercise Tolerance: Good METShypertension, Pt. on medications (-) CAD and (-) Past MI (-) dysrhythmias  Rhythm:Regular Rate:Normal - Systolic murmurs    Neuro/Psych negative neurological ROS  negative psych ROS   GI/Hepatic neg GERD  ,(+)     (-) substance abuse  ,   Endo/Other  neg diabetesMorbid obesity  Renal/GU negative Renal ROS     Musculoskeletal   Abdominal (+) + obese,   Peds  Hematology   Anesthesia Other Findings History reviewed. No pertinent past medical history.  Reproductive/Obstetrics (+) Pregnancy                             Anesthesia Physical Anesthesia Plan  ASA: III  Anesthesia Plan: Epidural   Post-op Pain Management:    Induction:   PONV Risk Score and Plan: 2 and Treatment may vary due to age or medical condition and Ondansetron  Airway Management Planned: Natural Airway  Additional Equipment:   Intra-op Plan:   Post-operative Plan:   Informed Consent: I have reviewed the patients History and Physical, chart, labs and discussed the procedure including the risks, benefits and alternatives for the proposed anesthesia with the patient or authorized representative who has indicated his/her understanding and acceptance.       Plan Discussed with: Surgeon  Anesthesia  Plan Comments: (Discussed R/B/A of neuraxial anesthesia technique with patient: - rare risks of spinal/epidural hematoma, nerve damage, infection - Risk of PDPH - Risk of itching - Risk of nausea and vomiting - Risk of poor block necessitating replacement of epidural. Patient voiced understanding.)        Anesthesia Quick Evaluation

## 2020-06-13 NOTE — H&P (Signed)
History and Physical   HPI  Shannon Rodgers is a 29 y.o. G2P1 at [redacted]w[redacted]d Estimated Date of Delivery: 06/27/20 who had limited prenatal care at [facility].  She reports SROM of clear fluid at @ 9AM.  C/O occ irregular contractions.   Describes first pregnancy as uncomplicated vaginal birth at term.  [facility]) Denies S&S associated with pre-E with severe features.   OB History  OB History  Gravida Para Term Preterm AB Living  2 1 0 0 0 1  SAB IAB Ectopic Multiple Live Births  0 0 0 0 0    # Outcome Date GA Lbr Len/2nd Weight Sex Delivery Anes PTL Lv  2 Current           1 Para             PROBLEM LIST  Pregnancy complications or risks: Patient Active Problem List   Diagnosis Date Noted  . Indication for care in labor or delivery 06/13/2020  . Premature rupture of membranes 06/13/2020  . Hypertension affecting pregnancy in third trimester 06/13/2020  . Limited prenatal care 06/13/2020  . Morbid obesity with BMI of 45.0-49.9, adult (HCC) 06/13/2020  . Round ligament pain 01/30/2020  . Lower abdominal pain 01/29/2020    Prenatal labs and studies: ABO, Rh: B/Positive/-- (07/15 0000) Antibody: Negative (07/15 0000) Rubella: Immune (07/15 0000) RPR: Nonreactive (12/08 0000)  HBsAg: Negative (07/15 0000)  HIV: Non-reactive (07/15 0000)  QMV:HQIONGEX/-- (12/09 0000)   History reviewed. No pertinent past medical history.   Past Surgical History:  Procedure Laterality Date  . NO PAST SURGERIES       Medications    There are no discharge medications for this patient.    Allergies  Penicillins  Review of Systems  Pertinent items noted in HPI and remainder of comprehensive ROS otherwise negative.  Physical Exam  Ht 5\' 6"  (1.676 m)   Wt 135.2 kg   LMP 08/27/2019 (Approximate)   BMI 48.10 kg/m   Lungs:  CTA B Cardio: RRR without M/R/G Abd: Soft, gravid, NT Presentation: cephalic  Confirmed by bedside 10/27/2019 EXT: No C/C/ 1+ Edema DTRs: 2+ B CERVIX:  Dilation: 1 Effacement (%): 50 Cervical Position: Middle Station: -3 Presentation: Vertex Exam by:: LM  See Prenatal records for more detailed PE.     FHR:  Variability: Good {> 6 bpm)  [place]oco: Occ mild contractions.  Test Results  Results for orders placed or performed during the hospital encounter of 06/13/20 (from the past 24 hour(s))  Resp Panel by RT-PCR (Flu A&B, Covid) Nasopharyngeal Swab     Status: None   Collection Time: 06/13/20  1:57 PM   Specimen: Nasopharyngeal Swab; Nasopharyngeal(NP) swabs in vial transport medium  Result Value Ref Range   SARS Coronavirus 2 by RT PCR NEGATIVE NEGATIVE   Influenza A by PCR NEGATIVE NEGATIVE   Influenza B by PCR NEGATIVE NEGATIVE     GBS negative  Assessment   G2P1 at [redacted]w[redacted]d Estimated Date of Delivery: 06/27/20  The fetus is reassuring.  Mild HTN noted. Elevated BMI  Patient Active Problem List   Diagnosis Date Noted  . Indication for care in labor or delivery 06/13/2020  . Premature rupture of membranes 06/13/2020  . Hypertension affecting pregnancy in third trimester 06/13/2020  . Limited prenatal care 06/13/2020  . Morbid obesity with BMI of 45.0-49.9, adult (HCC) 06/13/2020  . Round ligament pain 01/30/2020  . Lower abdominal pain 01/29/2020    Plan  1. Admit  to L&D :   2. EFM: -- Category 1 3. Stadol or Epidural if desired.   4. Admission labs  5. PC ratio, CMP, CBC  - check labs for possible pre-E 6.  UDS done - (limited prenatal care) 7.  Consider Misoprostol vs Pitocin for induction for PROM.  Elonda Husky, M.D. 06/13/2020 4:06 PM

## 2020-06-13 NOTE — Progress Notes (Signed)
Labor Progress Note  Shannon Rodgers is a 29 y.o. G2P1 at [redacted]w[redacted]d admitted for induction of labor due to Spontaneous rupture of BOW at 0900.  Subjective: Assumed care.  Objective: BP 140/90   Pulse 80   Temp 98.6 F (37 C) (Oral)   Ht 5\' 6"  (1.676 m)   Wt 135.2 kg   LMP 08/27/2019 (Approximate)   BMI 48.10 kg/m   Fetal Assessment: FHT:  FHR: 130 bpm, variability: moderate,  accelerations:  Present,  decelerations:  Absent Category/reactivity:  Category I UC:   none SVE:   @ 1435 Dilation: 1cm  Effacement: 50%  Station:  -3  Consistency: soft  Position: middle  Membrane status:SROM'd 0900 Amniotic color: Clear  Labs:  Results for orders placed or performed during the hospital encounter of 06/13/20 (from the past 24 hour(s))  Resp Panel by RT-PCR (Flu A&B, Covid) Nasopharyngeal Swab     Status: None   Collection Time: 06/13/20  1:57 PM   Specimen: Nasopharyngeal Swab; Nasopharyngeal(NP) swabs in vial transport medium  Result Value Ref Range   SARS Coronavirus 2 by RT PCR NEGATIVE NEGATIVE   Influenza A by PCR NEGATIVE NEGATIVE   Influenza B by PCR NEGATIVE NEGATIVE  Protein / creatinine ratio, urine     Status: None   Collection Time: 06/13/20  3:44 PM  Result Value Ref Range   Creatinine, Urine 169 mg/dL   Total Protein, Urine 16 mg/dL   Protein Creatinine Ratio 0.09 0.00 - 0.15 mg/mg[Cre]  Urine Drug Screen, Qualitative ([facility] only)     Status: None   Collection Time: 06/13/20  3:44 PM  Result Value Ref Range   Tricyclic, Ur Screen NONE DETECTED NONE DETECTED   Amphetamines, Ur Screen NONE DETECTED NONE DETECTED   MDMA (Ecstasy)Ur Screen NONE DETECTED NONE DETECTED   Cocaine Metabolite,Ur [place] NONE DETECTED NONE DETECTED   Opiate, Ur Screen NONE DETECTED NONE DETECTED   Phencyclidine (PCP) Ur S NONE DETECTED NONE DETECTED   Cannabinoid 50 Ng, Ur [place] NONE DETECTED NONE DETECTED   Barbiturates, Ur Screen NONE DETECTED NONE DETECTED   Benzodiazepine, Ur Scrn NONE  DETECTED NONE DETECTED   Methadone Scn, Ur NONE DETECTED NONE DETECTED  CBC     Status: Abnormal   Collection Time: 06/13/20  3:52 PM  Result Value Ref Range   WBC 9.0 4.0 - 10.5 K/uL   RBC 4.39 3.87 - 5.11 MIL/uL   Hemoglobin 11.4 (L) 12.0 - 15.0 g/dL   HCT 06/15/20 (L) 73.7 - 10.6 %   MCV 77.4 (L) 80.0 - 100.0 fL   MCH 26.0 26.0 - 34.0 pg   MCHC 33.5 30.0 - 36.0 g/dL   RDW 26.9 48.5 - 46.2 %   Platelets 215 150 - 400 K/uL   nRBC 0.0 0.0 - 0.2 %  Type and screen [facility]     Status: None   Collection Time: 06/13/20  3:52 PM  Result Value Ref Range   ABO/RH(D) B POS    Antibody Screen NEG    Sample Expiration      06/16/2020,2359 Performed at [facility], [address] [place], [place]   Comprehensive metabolic panel     Status: Abnormal   Collection Time: 06/13/20  3:52 PM  Result Value Ref Range   Sodium 136 135 - 145 mmol/L   Potassium 3.8 3.5 - 5.1 mmol/L   Chloride 105 98 - 111 mmol/L   CO2 20 (L) 22 - 32 mmol/L  Glucose, Bld 83 70 - 99 mg/dL   BUN 10 6 - 20 mg/dL   Creatinine, Ser 2.95 0.44 - 1.00 mg/dL   Calcium 9.0 8.9 - 62.1 mg/dL   Total Protein 6.3 (L) 6.5 - 8.1 g/dL   Albumin 2.7 (L) 3.5 - 5.0 g/dL   AST 14 (L) 15 - 41 U/L   ALT 11 0 - 44 U/L   Alkaline Phosphatase 91 38 - 126 U/L   Total Bilirubin 0.3 0.3 - 1.2 mg/dL   GFR, Estimated >30 >86 mL/min   Anion gap 11 5 - 15    Assessment / Plan: Induction of labor due to PROM, Cytotec PV given at 1629  Labor: Progressing normally Preeclampsia:  140/90 Labs stable (above), denies HA, vision changes or epigastric pain Fetal Wellbeing:  Category I Pain Control:  Labor support without medications I/D:  Afebrile, GBS neg, SROM x 9hr Anticipated MOD:  NSVD  [name], CNM 06/13/2020, 5:53 PM

## 2020-06-13 NOTE — Progress Notes (Signed)
 Misoprostol placed after discussion with pt. (@430 )

## 2020-06-14 LAB — CBC
HCT: 33.5 % — ABNORMAL LOW (ref 36.0–46.0)
Hemoglobin: 10.9 g/dL — ABNORMAL LOW (ref 12.0–15.0)
MCH: 25.5 pg — ABNORMAL LOW (ref 26.0–34.0)
MCHC: 32.5 g/dL (ref 30.0–36.0)
MCV: 78.3 fL — ABNORMAL LOW (ref 80.0–100.0)
Platelets: 187 10*3/uL (ref 150–400)
RBC: 4.28 MIL/uL (ref 3.87–5.11)
RDW: 14.6 % (ref 11.5–15.5)
WBC: 14.4 10*3/uL — ABNORMAL HIGH (ref 4.0–10.5)
nRBC: 0 % (ref 0.0–0.2)

## 2020-06-14 LAB — RPR: RPR Ser Ql: NONREACTIVE

## 2020-06-14 MED ORDER — PRENATAL MULTIVITAMIN CH
1.0000 | ORAL_TABLET | Freq: Every day | ORAL | Status: DC
Start: 2020-06-14 — End: 2020-06-15
  Administered 2020-06-14 – 2020-06-15 (×2): 1 via ORAL
  Filled 2020-06-14 (×2): qty 1

## 2020-06-14 MED ORDER — TETANUS-DIPHTH-ACELL PERTUSSIS 5-2.5-18.5 LF-MCG/0.5 IM SUSY
0.5000 mL | PREFILLED_SYRINGE | Freq: Once | INTRAMUSCULAR | Status: DC
  Filled 2020-06-14: qty 0.5

## 2020-06-14 MED ORDER — ONDANSETRON HCL 4 MG PO TABS
4.0000 mg | ORAL_TABLET | ORAL | Status: DC | PRN
  Filled 2020-06-14: qty 1

## 2020-06-14 MED ORDER — OXYCODONE HCL 5 MG PO TABS: 10.0000 mg | ORAL_TABLET | ORAL | Status: DC | PRN

## 2020-06-14 MED ORDER — OXYCODONE HCL 5 MG PO TABS: 5.0000 mg | ORAL_TABLET | ORAL | Status: DC | PRN

## 2020-06-14 MED ORDER — ONDANSETRON HCL 4 MG/2ML IJ SOLN: 4.0000 mg | INTRAMUSCULAR | Status: DC | PRN

## 2020-06-14 MED ORDER — WITCH HAZEL-GLYCERIN EX PADS: 1.0000 "application " | MEDICATED_PAD | CUTANEOUS | Status: DC | PRN

## 2020-06-14 MED ORDER — DIPHENHYDRAMINE HCL 25 MG PO CAPS: 25.0000 mg | ORAL_CAPSULE | Freq: Four times a day (QID) | ORAL | Status: DC | PRN

## 2020-06-14 MED ORDER — FERROUS SULFATE 325 (65 FE) MG PO TABS
325.0000 mg | ORAL_TABLET | Freq: Two times a day (BID) | ORAL | Status: DC
  Administered 2020-06-14 – 2020-06-15 (×3): 325 mg via ORAL
  Filled 2020-06-14 (×3): qty 1

## 2020-06-14 MED ORDER — IBUPROFEN 600 MG PO TABS
600.0000 mg | ORAL_TABLET | Freq: Four times a day (QID) | ORAL | Status: DC
  Administered 2020-06-14 – 2020-06-15 (×7): 600 mg via ORAL
  Filled 2020-06-14 (×7): qty 1

## 2020-06-14 MED ORDER — DIBUCAINE (PERIANAL) 1 % EX OINT: 1.0000 "application " | TOPICAL_OINTMENT | CUTANEOUS | Status: DC | PRN

## 2020-06-14 MED ORDER — DOCUSATE SODIUM 100 MG PO CAPS
100.0000 mg | ORAL_CAPSULE | Freq: Two times a day (BID) | ORAL | Status: DC
  Administered 2020-06-15: 08:00:00 100 mg via ORAL
  Filled 2020-06-14: qty 1

## 2020-06-14 MED ORDER — ACETAMINOPHEN 325 MG PO TABS
650.0000 mg | ORAL_TABLET | ORAL | Status: DC | PRN
  Administered 2020-06-15 (×2): 650 mg via ORAL
  Filled 2020-06-14 (×2): qty 2

## 2020-06-14 MED ORDER — BENZOCAINE-MENTHOL 20-0.5 % EX AERO
1.0000 "application " | INHALATION_SPRAY | CUTANEOUS | Status: DC | PRN
  Administered 2020-06-14: 1 via TOPICAL
  Filled 2020-06-14: qty 56

## 2020-06-14 MED ORDER — SIMETHICONE 80 MG PO CHEW: 80.0000 mg | CHEWABLE_TABLET | ORAL | Status: DC | PRN

## 2020-06-14 MED ORDER — COCONUT OIL OIL
1.0000 "application " | TOPICAL_OIL | Status: DC | PRN
  Filled 2020-06-14: qty 120

## 2020-06-14 NOTE — Anesthesia Postprocedure Evaluation (Signed)
Anesthesia Post Note  Patient: Shannon Rodgers  Procedure(s) Performed: AN AD HOC LABOR EPIDURAL  Patient location during evaluation: Mother Baby Anesthesia Type: Epidural Level of consciousness: awake and alert Pain management: pain level controlled Vital Signs Assessment: post-procedure vital signs reviewed and stable Respiratory status: spontaneous breathing, nonlabored ventilation and respiratory function stable Cardiovascular status: stable Postop Assessment: no headache, no backache, patient able to bend at knees and able to ambulate Anesthetic complications: no   No complications documented.   Last Vitals:  Vitals:   06/14/20 0440 06/14/20 0751  BP: 120/63 120/78  Pulse: 80 75  Resp: 18 18  Temp: 36.6 C 36.9 C  SpO2: 99% 98%    Last Pain:  Vitals:   06/14/20 0751  TempSrc: Oral  PainSc:                  Cleda Mccreedy Dago Jungwirth

## 2020-06-14 NOTE — Discharge Summary (Signed)
Obstetrical Discharge Summary  Patient Name: Shannon Rodgers DOB: [birth year] MRN: [id number]  Date of Admission: 06/13/2020 Date of Delivery: 06/14/20 Delivered by: Shannon Rodgers Date of Discharge: 06/15/2020  Primary OB: Shannon Rodgers JSE:GBTDVVO'H last menstrual period was 08/27/2019 (approximate). EDC Estimated Date of Delivery: 06/27/20 Gestational Age at Delivery: [redacted]w[redacted]d   Antepartum complications:  Limited PNC Obesity  Admitting Diagnosis: PROM Secondary Diagnosis: SVD Patient Active Problem List   Diagnosis Date Noted  . NSVD (normal spontaneous vaginal delivery) 06/14/2020  . Limited prenatal care 06/13/2020  . Morbid obesity with BMI of 45.0-49.9, adult (HCC) 06/13/2020  . Round ligament pain 01/30/2020  . Lower abdominal pain 01/29/2020    Augmentation: Cytotec Complications: None  Intrapartum complications/course:  28yo [id number] at 39+4wks presenting with PROM, SROM with clear fluid.  She progressed to complete and pushed over an intact perineum and delivered the fetal head, followed promptly by the shoulders. She was in control the whole time, and the baby placed on the maternal abdomen. Delayed cord clamping and the FOB cut his cord, while he was skin to skin. The placenta delivered spontaneously and intact. No lacerations. Mom and baby tolerated the procedure well.  Delivery Type: spontaneous vaginal delivery Anesthesia: epidural Placenta: spontaneous Laceration: none Episiotomy: none Newborn Data: Live born female  Birth Weight: 7#7 APGAR: 8, 9 "Shannon Rodgers"  Newborn Delivery   Birth date/time: 06/14/2020 00:37:00 Delivery type: Vaginal, Spontaneous         Postpartum Procedures: none  Post partum course:  Patient had an uncomplicated postpartum course.  By time of discharge on PPD#1, her pain was controlled on oral pain medications; she had appropriate lochia and was ambulating, voiding without difficulty and tolerating regular diet.  She was deemed  stable for discharge to home.    Discharge Physical Exam:  BP 128/83 (BP Location: Left Arm)   Pulse 73   Temp 97.6 F (36.4 C) (Oral)   Resp 20   Ht 5\' 6"  (1.676 m)   Wt 135.2 kg   LMP 08/27/2019 (Approximate)   SpO2 99%   Breastfeeding Unknown   BMI 48.10 kg/m   General: alert and no distress Pulm: normal respiratory effort Lochia: appropriate Abdomen: soft, NT Uterine Fundus: firm, below umbilicus Extremities: No evidence of DVT seen on physical exam. No lower extremity edema.  Edinburgh05/06/2019 Postnatal Depression Scale Screening Tool 06/15/2020  I have been able to laugh and see the funny side of things. 0  I have looked forward with enjoyment to things. 0  I have blamed myself unnecessarily when things went wrong. 1  I have been anxious or worried for no good reason. 1  I have felt scared or panicky for no good reason. 2  Things have been getting on top of me. 1  I have been so unhappy that I have had difficulty sleeping. 1  I have felt sad or miserable. 1  I have been so unhappy that I have been crying. 1  The thought of harming myself has occurred to me. 0  Edinburgh Postnatal Depression Scale Total 8     Labs: CBC Latest Ref Rng & Units 06/14/2020 06/13/2020 01/30/2020  WBC 4.0 - 10.5 K/uL 14.4(H) 9.0 9.1  Hemoglobin 12.0 - 15.0 g/dL 10.9(L) 11.4(L) 10.7(L)  Hematocrit 36.0 - 46.0 % 33.5(L) 34.0(L) 31.5(L)  Platelets 150 - 400 K/uL 187 215 214   B POS Performed at [facility], [address] [place], [place]  Hemoglobin  Date Value Ref Range  Status  06/14/2020 10.9 (L) 12.0 - 15.0 g/dL Final   HGB  Date Value Ref Range Status  09/29/2011 12.7 12.0 - 16.0 g/dL Final   HCT  Date Value Ref Range Status  06/14/2020 33.5 (L) 36.0 - 46.0 % Final  09/29/2011 37.3 35.0 - 47.0 % Final    Disposition: stable, discharge to home Baby Feeding: breastmilk Baby Disposition: home with mom  Contraception: Nexplanon  Prenatal Labs:   Blood type/Rh B pos  Antibody screen neg  Rubella Immune  Varicella immune  RPR NR  HBsAg Neg  HIV NR  GC Neg  Chlamydia Neg  Genetic screening   1 hour GTT  not done  3 hour GTT   GBS Neg   Rh Immune globulin given: N/a Rubella vaccine given: Immune Varicella vaccine given: Immune Tdap vaccine given in AP or PP setting: given 06/03/20 Flu vaccine given in AP or PP setting: offered prior to DC  Plan: Shannon Rodgers was discharged to home in good condition. Follow-up appointment with delivering provider in 6 weeks.  Discharge Instructions: Per After Visit Summary. Activity: Advance as tolerated. Pelvic rest for 6 weeks.   Diet: Regular Discharge Medications: Allergies as of 06/15/2020      Reactions   Penicillins Swelling      Medication List    TAKE these medications   acetaminophen 325 MG tablet Commonly known as: Tylenol Take 2 tablets (650 mg total) by mouth every 4 (four) hours as needed (for pain scale < 4).   benzocaine-Menthol 20-0.5 % Aero Commonly known as: DERMOPLAST Apply 1 application topically as needed for irritation (perineal discomfort).   coconut oil Oil Apply 1 application topically as needed.   docusate sodium 100 MG capsule Commonly known as: COLACE Take 1 capsule (100 mg total) by mouth 2 (two) times Shannon.   ibuprofen 600 MG tablet Commonly known as: ADVIL Take 1 tablet (600 mg total) by mouth every 6 (six) hours.   prenatal multivitamin Tabs tablet Take 1 tablet by mouth Shannon at 12 noon.   witch hazel-glycerin pad Commonly known as: TUCKS Apply 1 application topically as needed for hemorrhoids.      Outpatient follow up:   Follow-up Information    Center, [facility] Schedule an appointment as soon as possible for a visit in 6 week(s).   Specialty: General Practice Why: Please call to schedule a 6 week postpartum follow up appointment with Shannon Rodgers [facility] Contact information: [address] [place] [address] [phone]               Signed: Randa Ngo, CNM 06/15/2020 10:03 AM

## 2020-06-14 NOTE — Lactation Note (Signed)
This note was copied from a baby's chart. Lactation Consultation Note  Patient Name: Shannon Rodgers [id number]'[id number] Date: 06/14/2020 Reason for consult: Follow-up assessment;Early term 37-38.6wks;Nipple pain/trauma  Assisted mom with positioning [name] on left breast in football hold skin to skin.  He latched with minimal assistance and began strong, rhythmic sucking with occasional swallow.  Mom experiencing cramping when breast feeding which explained to mom as positive sign.  Mom requested DEBP kit.  Symphony set up in room with instructions in breast massage, hand expressioin, pumping, collection, storage, cleaning, labeling and handling of expressed milk.  Mom chose not to pump at present since [name] is nursing so well.  Mom's nipples are tender when try to hand express.  Coconut oil given with instructions in use.  Mom breast fed 37 year old for 6 months.  The only challenges with breast feeding first was perceived low milk supply in the beginning and having to supplement which caused issues with getting baby back to the breast at first.  Mom wants to avoid supplementing unless absolutely necessary.  Hand out given on what to expect with feedings the first 4 days of life reviewing normal newborn stomach size, supply and demand, feeding cues, adequate intake and out put, normal course of lactation and routine newborn feeding patterns.  Lactation [employer] and [employer] hand out given and reviewed contact numbers, informative web sites and support groups.  Lactation name and number written on white board and encouraged to call with any questions, concerns or assistance.   Maternal Data Formula Feeding for Exclusion: No Has patient been taught Hand Expression?: Yes Does the patient have breastfeeding experience prior to this delivery?: Yes  Feeding Feeding Type: Breast Fed  LATCH Score Latch: Grasps breast easily, tongue down, lips flanged, rhythmical sucking.  Audible Swallowing: A few  with stimulation  Type of Nipple: Everted at rest and after stimulation  Comfort (Breast/Nipple): Filling, red/small blisters or bruises, mild/mod discomfort  Hold (Positioning): Assistance needed to correctly position infant at breast and maintain latch.  LATCH Score: 7  Interventions Interventions: Breast feeding basics reviewed;Assisted with latch;Skin to skin;Breast massage;Hand express;Pre-pump if needed;Breast compression;Adjust position;Support pillows;Position options;Coconut oil  Lactation Tools Discussed/Used Tools: Pump;Coconut oil Breast pump type: Double-Electric Breast Pump;Manual WIC Program: Yes Pump Review: Setup, frequency, and cleaning;Milk Storage;Other (comment) Initiated by:: S.Abdelrahman Nair,RNC,BSN,IBCLC Date initiated:: 06/14/20   Consult Status Consult Status: Follow-up Follow-up type: Call as needed    Jarold Motto 06/14/2020, 7:00 PM

## 2020-06-15 ENCOUNTER — Ambulatory Visit: Payer: Self-pay

## 2020-06-15 MED ORDER — IBUPROFEN 600 MG PO TABS: 600.0000 mg | ORAL_TABLET | Freq: Four times a day (QID) | ORAL | 0 refills | Status: AC

## 2020-06-15 MED ORDER — BENZOCAINE-MENTHOL 20-0.5 % EX AERO: 1.0000 "application " | INHALATION_SPRAY | CUTANEOUS | Status: AC | PRN

## 2020-06-15 MED ORDER — WITCH HAZEL-GLYCERIN EX PADS: 1.0000 "application " | MEDICATED_PAD | CUTANEOUS | 12 refills | Status: AC | PRN

## 2020-06-15 MED ORDER — PRENATAL MULTIVITAMIN CH: 1.0000 | ORAL_TABLET | Freq: Every day | ORAL | Status: AC

## 2020-06-15 MED ORDER — COCONUT OIL OIL: 1.0000 "application " | TOPICAL_OIL | 0 refills | Status: AC | PRN

## 2020-06-15 MED ORDER — ACETAMINOPHEN 325 MG PO TABS
650.0000 mg | ORAL_TABLET | ORAL | 0 refills | Status: AC | PRN
Start: 2020-06-15 — End: ?

## 2020-06-15 MED ORDER — DOCUSATE SODIUM 100 MG PO CAPS: 100.0000 mg | ORAL_CAPSULE | Freq: Two times a day (BID) | ORAL | 0 refills | Status: AC

## 2020-06-15 NOTE — Progress Notes (Signed)
Discharge order received from doctor. Reviewed discharge instructions and prescriptions with patient and answered all questions. Follow up appointment instructions given. Patient verbalized understanding. ID bands checked. Patient discharged home with infant via wheelchair by nursing/auxillary.    Sharanya Templin, RN  

## 2020-06-15 NOTE — Discharge Instructions (Signed)

## 2020-06-15 NOTE — Clinical Social Work Maternal (Signed)
CLINICAL SOCIAL WORK MATERNAL/CHILD NOTE  Patient Details  Name: Shannon Rodgers MRN: [id number] Date of Birth: [birth year]  Date:  06/15/2020  Clinical Social Worker Initiating Note:  [place] prenatal visit (3 prenatal visits) Date/Time: Initiated:  06/15/20/0030     Child's Name:  Boy Shannon Rodgers   Biological Parents:  Mother,Father (Shannon Rodgers, father of baby)   Need for Interpreter:  None   Reason for Referral:  Late or No Prenatal Care  (Patinet reported that she was busy wiht her 26 year old. Not able to make it to doctors appointment and other times she was out of town.)   Address:  [place] [address]    Phone number:  [phone] (home)     Additional phone number: [phone]  Household Members/Support Persons (HM/SP):   Household Member/Support Person 1   HM/SP Name Relationship DOB or Age  HM/SP -1 Shannon Rodgers, Father    HM/SP -2        HM/SP -3        HM/SP -4        HM/SP -5        HM/SP -6        HM/SP -7        HM/SP -8          Natural Supports (not living in the home):  Armed forces training and education officer Supports: None   Employment: Other (comment)   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 CSW explain HIPPA, and asked patient's  support person, Shannon Rodgers FOB, to step out of  the room. Patient reported that she would like Mr. Shannon Rodgers to remain in the room during this assessment.  Social worker explained to the patient reason for the consult.  Patient is a 29 year old female who gave birth to a baby boy on 06/14/2020. Patient reported that she is doing well. CSW discussed the need for treatment during pregnancy and follow-up care after pregnancy. Patient reported that she has an 77-year-old son that lives in the home.  Noted that she has a primary care provider at [facility], Dr. Tomasa Rodgers.  Noted that she missed her appointments with Dr. Clide Rodgers due to "not being able to get to the clinic or I was out of town."  Patient reported that she does not have a pediatrician. Intends on calling [facility] and [place] for appointment.   Patient reported that the FOB is a good support for her.  No issues reported.   Psychoeducation about Postpartum depression. SW explained to the patient that feelings or thoughts of self-harm and harming her baby, extreme fear  about baby's wellbeing, not accepting help from support, not wanting to engage in activities or leave the home, not wanting to care for baby, or feeling you are the only one who can care for baby are all symptoms of postpartum depression. SW encouraged mother of baby to asked for help/purport by consulting her primary care provider and or talking to her supports about what is happening.  Postpartum: first 2 weeks after delivery emotions may be up and down due hormones rebalancing. If symptoms occur after that 2 week it could be PPD Patient reported that her home is set up for her newborn.   CSW Plan/Description:  No Further Intervention Required/No Barriers to Discharge    Shannon Bouton, LCSW 06/15/2020, 12:13 PM

## 2020-06-15 NOTE — Lactation Note (Signed)
This note was copied from a baby's chart. Lactation Consultation Note  Patient Name: Boy Bobbie Virden [id number] Date: 06/15/2020   Observed good breast feeds this am.  Darin Engels has been cluster feeding.  Mom requesting DBM d/t him continually on the breast and does not want him to get any formula.  Mom exhausted, having severe abdominal cramping with breast feeding and sore nipples.  He took 10 ml of DBM and was contented until discharge.  Remainder of 100 ml of thawed DBM given to mom on discharge to supplement when needed until mature milk transitions in.  Stressed importance of continuing to put Darin Engels to the breast with feeding cues to bring in mature milk, ensure a plentiful milk supply and prevent engorgement.  Encouraged mom to call [facility] with any questions, concerns or further assistance.  Maternal Data    Feeding    LATCH Score                   Interventions    Lactation Tools Discussed/Used     Consult Status      Louis Meckel 06/15/2020, 8:13 PM

## 2023-08-12 ENCOUNTER — Other Ambulatory Visit: Payer: Self-pay | Admitting: Family Medicine

## 2023-08-12 DIAGNOSIS — N9412 Deep dyspareunia: Secondary | ICD-10-CM

## 2023-08-17 ENCOUNTER — Ambulatory Visit
Admission: RE | Admit: 2023-08-17 | Discharge: 2023-08-17 | Disposition: A | Discharge status: Home / Self Care | Payer: Medicaid Other | Source: Ambulatory Visit | Attending: Family Medicine | Admitting: Family Medicine

## 2023-08-17 DIAGNOSIS — N9412 Deep dyspareunia: Secondary | ICD-10-CM | POA: Diagnosis present
